# Patient Record
Sex: Female | Born: 1980 | Hispanic: No | Smoking: Never smoker
Health system: Southern US, Community
[De-identification: ages and names within clinical notes are randomized; demographics above are authoritative.]

---

## 2005-07-23 ENCOUNTER — Other Ambulatory Visit: Admission: RE | Admit: 2005-07-23 | Discharge: 2005-07-23 | Payer: Self-pay | Admitting: Family Medicine

## 2006-11-16 ENCOUNTER — Other Ambulatory Visit: Admission: RE | Admit: 2006-11-16 | Discharge: 2006-11-16 | Payer: Self-pay | Admitting: Family Medicine

## 2007-11-08 ENCOUNTER — Other Ambulatory Visit: Admission: RE | Admit: 2007-11-08 | Discharge: 2007-11-08 | Payer: Self-pay | Admitting: Family Medicine

## 2009-04-16 ENCOUNTER — Other Ambulatory Visit: Admission: RE | Admit: 2009-04-16 | Discharge: 2009-04-16 | Payer: Self-pay | Admitting: Family Medicine

## 2012-05-11 ENCOUNTER — Ambulatory Visit: Payer: Self-pay | Admitting: Licensed Clinical Social Worker

## 2012-05-20 ENCOUNTER — Ambulatory Visit (INDEPENDENT_AMBULATORY_CARE_PROVIDER_SITE_OTHER): Payer: BC Managed Care – PPO | Admitting: Licensed Clinical Social Worker

## 2012-05-20 DIAGNOSIS — IMO0002 Reserved for concepts with insufficient information to code with codable children: Secondary | ICD-10-CM

## 2012-06-15 ENCOUNTER — Ambulatory Visit (INDEPENDENT_AMBULATORY_CARE_PROVIDER_SITE_OTHER): Payer: BC Managed Care – PPO | Admitting: Licensed Clinical Social Worker

## 2012-06-15 DIAGNOSIS — IMO0002 Reserved for concepts with insufficient information to code with codable children: Secondary | ICD-10-CM

## 2012-07-27 ENCOUNTER — Ambulatory Visit (INDEPENDENT_AMBULATORY_CARE_PROVIDER_SITE_OTHER): Payer: BC Managed Care – PPO | Admitting: Licensed Clinical Social Worker

## 2012-07-27 DIAGNOSIS — IMO0002 Reserved for concepts with insufficient information to code with codable children: Secondary | ICD-10-CM

## 2012-09-16 ENCOUNTER — Ambulatory Visit (INDEPENDENT_AMBULATORY_CARE_PROVIDER_SITE_OTHER): Payer: BC Managed Care – PPO | Admitting: Licensed Clinical Social Worker

## 2012-09-16 DIAGNOSIS — IMO0002 Reserved for concepts with insufficient information to code with codable children: Secondary | ICD-10-CM

## 2012-11-09 ENCOUNTER — Ambulatory Visit (INDEPENDENT_AMBULATORY_CARE_PROVIDER_SITE_OTHER): Payer: BC Managed Care – PPO | Admitting: Licensed Clinical Social Worker

## 2012-11-09 DIAGNOSIS — IMO0002 Reserved for concepts with insufficient information to code with codable children: Secondary | ICD-10-CM

## 2012-12-09 ENCOUNTER — Ambulatory Visit (INDEPENDENT_AMBULATORY_CARE_PROVIDER_SITE_OTHER): Payer: BC Managed Care – PPO | Admitting: Licensed Clinical Social Worker

## 2012-12-09 DIAGNOSIS — IMO0002 Reserved for concepts with insufficient information to code with codable children: Secondary | ICD-10-CM

## 2013-01-06 ENCOUNTER — Ambulatory Visit (INDEPENDENT_AMBULATORY_CARE_PROVIDER_SITE_OTHER): Payer: BC Managed Care – PPO | Admitting: Licensed Clinical Social Worker

## 2013-01-06 DIAGNOSIS — IMO0002 Reserved for concepts with insufficient information to code with codable children: Secondary | ICD-10-CM

## 2013-02-01 ENCOUNTER — Ambulatory Visit (INDEPENDENT_AMBULATORY_CARE_PROVIDER_SITE_OTHER): Payer: BC Managed Care – PPO | Admitting: Licensed Clinical Social Worker

## 2013-02-01 DIAGNOSIS — IMO0002 Reserved for concepts with insufficient information to code with codable children: Secondary | ICD-10-CM

## 2013-03-22 ENCOUNTER — Ambulatory Visit: Payer: BC Managed Care – PPO | Admitting: Licensed Clinical Social Worker

## 2013-03-29 ENCOUNTER — Ambulatory Visit (INDEPENDENT_AMBULATORY_CARE_PROVIDER_SITE_OTHER): Payer: BC Managed Care – PPO | Admitting: Licensed Clinical Social Worker

## 2013-03-29 DIAGNOSIS — IMO0002 Reserved for concepts with insufficient information to code with codable children: Secondary | ICD-10-CM

## 2013-04-26 ENCOUNTER — Ambulatory Visit (INDEPENDENT_AMBULATORY_CARE_PROVIDER_SITE_OTHER): Payer: BC Managed Care – PPO | Admitting: Licensed Clinical Social Worker

## 2013-04-26 DIAGNOSIS — IMO0002 Reserved for concepts with insufficient information to code with codable children: Secondary | ICD-10-CM

## 2013-05-24 ENCOUNTER — Ambulatory Visit (INDEPENDENT_AMBULATORY_CARE_PROVIDER_SITE_OTHER): Payer: BC Managed Care – PPO | Admitting: Licensed Clinical Social Worker

## 2013-05-24 DIAGNOSIS — IMO0002 Reserved for concepts with insufficient information to code with codable children: Secondary | ICD-10-CM

## 2013-06-23 ENCOUNTER — Ambulatory Visit: Payer: BC Managed Care – PPO | Admitting: Licensed Clinical Social Worker

## 2013-07-26 ENCOUNTER — Ambulatory Visit (INDEPENDENT_AMBULATORY_CARE_PROVIDER_SITE_OTHER): Payer: BC Managed Care – PPO | Admitting: Licensed Clinical Social Worker

## 2013-07-26 DIAGNOSIS — IMO0002 Reserved for concepts with insufficient information to code with codable children: Secondary | ICD-10-CM

## 2013-08-25 ENCOUNTER — Ambulatory Visit (INDEPENDENT_AMBULATORY_CARE_PROVIDER_SITE_OTHER): Payer: BC Managed Care – PPO | Admitting: Licensed Clinical Social Worker

## 2013-08-25 DIAGNOSIS — IMO0002 Reserved for concepts with insufficient information to code with codable children: Secondary | ICD-10-CM

## 2014-02-23 ENCOUNTER — Ambulatory Visit (INDEPENDENT_AMBULATORY_CARE_PROVIDER_SITE_OTHER): Payer: BC Managed Care – PPO | Admitting: Licensed Clinical Social Worker

## 2014-02-23 DIAGNOSIS — IMO0002 Reserved for concepts with insufficient information to code with codable children: Secondary | ICD-10-CM

## 2014-03-30 ENCOUNTER — Ambulatory Visit (INDEPENDENT_AMBULATORY_CARE_PROVIDER_SITE_OTHER): Payer: BC Managed Care – PPO | Admitting: Licensed Clinical Social Worker

## 2014-03-30 DIAGNOSIS — IMO0002 Reserved for concepts with insufficient information to code with codable children: Secondary | ICD-10-CM

## 2014-05-04 ENCOUNTER — Ambulatory Visit (INDEPENDENT_AMBULATORY_CARE_PROVIDER_SITE_OTHER): Payer: BC Managed Care – PPO | Admitting: Licensed Clinical Social Worker

## 2014-05-04 DIAGNOSIS — F3341 Major depressive disorder, recurrent, in partial remission: Secondary | ICD-10-CM

## 2014-05-25 ENCOUNTER — Ambulatory Visit: Payer: BC Managed Care – PPO | Admitting: Licensed Clinical Social Worker

## 2014-06-08 ENCOUNTER — Ambulatory Visit (INDEPENDENT_AMBULATORY_CARE_PROVIDER_SITE_OTHER): Payer: BC Managed Care – PPO | Admitting: Licensed Clinical Social Worker

## 2014-06-08 DIAGNOSIS — F3341 Major depressive disorder, recurrent, in partial remission: Secondary | ICD-10-CM

## 2014-07-06 ENCOUNTER — Ambulatory Visit (INDEPENDENT_AMBULATORY_CARE_PROVIDER_SITE_OTHER): Payer: BC Managed Care – PPO | Admitting: Licensed Clinical Social Worker

## 2014-07-06 DIAGNOSIS — F3341 Major depressive disorder, recurrent, in partial remission: Secondary | ICD-10-CM

## 2014-08-03 ENCOUNTER — Ambulatory Visit (INDEPENDENT_AMBULATORY_CARE_PROVIDER_SITE_OTHER): Payer: BLUE CROSS/BLUE SHIELD | Admitting: Licensed Clinical Social Worker

## 2014-08-03 DIAGNOSIS — F3341 Major depressive disorder, recurrent, in partial remission: Secondary | ICD-10-CM

## 2014-09-07 ENCOUNTER — Ambulatory Visit (INDEPENDENT_AMBULATORY_CARE_PROVIDER_SITE_OTHER): Payer: BLUE CROSS/BLUE SHIELD | Admitting: Licensed Clinical Social Worker

## 2014-09-07 DIAGNOSIS — F3341 Major depressive disorder, recurrent, in partial remission: Secondary | ICD-10-CM

## 2014-10-05 ENCOUNTER — Ambulatory Visit (INDEPENDENT_AMBULATORY_CARE_PROVIDER_SITE_OTHER): Payer: BLUE CROSS/BLUE SHIELD | Admitting: Licensed Clinical Social Worker

## 2014-10-05 DIAGNOSIS — F3341 Major depressive disorder, recurrent, in partial remission: Secondary | ICD-10-CM

## 2014-11-23 ENCOUNTER — Ambulatory Visit (INDEPENDENT_AMBULATORY_CARE_PROVIDER_SITE_OTHER): Payer: BLUE CROSS/BLUE SHIELD | Admitting: Licensed Clinical Social Worker

## 2014-11-23 DIAGNOSIS — F3341 Major depressive disorder, recurrent, in partial remission: Secondary | ICD-10-CM

## 2015-01-04 ENCOUNTER — Ambulatory Visit (INDEPENDENT_AMBULATORY_CARE_PROVIDER_SITE_OTHER): Payer: BLUE CROSS/BLUE SHIELD | Admitting: Licensed Clinical Social Worker

## 2015-01-04 DIAGNOSIS — F3341 Major depressive disorder, recurrent, in partial remission: Secondary | ICD-10-CM

## 2015-02-01 ENCOUNTER — Ambulatory Visit (INDEPENDENT_AMBULATORY_CARE_PROVIDER_SITE_OTHER): Payer: BLUE CROSS/BLUE SHIELD | Admitting: Licensed Clinical Social Worker

## 2015-02-01 DIAGNOSIS — F3341 Major depressive disorder, recurrent, in partial remission: Secondary | ICD-10-CM

## 2015-03-01 ENCOUNTER — Ambulatory Visit (INDEPENDENT_AMBULATORY_CARE_PROVIDER_SITE_OTHER): Payer: BLUE CROSS/BLUE SHIELD | Admitting: Licensed Clinical Social Worker

## 2015-03-01 DIAGNOSIS — F3341 Major depressive disorder, recurrent, in partial remission: Secondary | ICD-10-CM | POA: Diagnosis not present

## 2015-03-29 ENCOUNTER — Ambulatory Visit (INDEPENDENT_AMBULATORY_CARE_PROVIDER_SITE_OTHER): Payer: BLUE CROSS/BLUE SHIELD | Admitting: Licensed Clinical Social Worker

## 2015-03-29 DIAGNOSIS — F3341 Major depressive disorder, recurrent, in partial remission: Secondary | ICD-10-CM | POA: Diagnosis not present

## 2015-04-26 ENCOUNTER — Ambulatory Visit (INDEPENDENT_AMBULATORY_CARE_PROVIDER_SITE_OTHER): Payer: BLUE CROSS/BLUE SHIELD | Admitting: Licensed Clinical Social Worker

## 2015-04-26 DIAGNOSIS — F3341 Major depressive disorder, recurrent, in partial remission: Secondary | ICD-10-CM

## 2015-06-07 ENCOUNTER — Ambulatory Visit: Payer: BLUE CROSS/BLUE SHIELD | Admitting: Licensed Clinical Social Worker

## 2015-07-05 ENCOUNTER — Ambulatory Visit (INDEPENDENT_AMBULATORY_CARE_PROVIDER_SITE_OTHER): Payer: BLUE CROSS/BLUE SHIELD | Admitting: Licensed Clinical Social Worker

## 2015-07-05 DIAGNOSIS — F3341 Major depressive disorder, recurrent, in partial remission: Secondary | ICD-10-CM

## 2015-08-02 ENCOUNTER — Ambulatory Visit: Payer: BLUE CROSS/BLUE SHIELD | Admitting: Licensed Clinical Social Worker

## 2015-08-30 ENCOUNTER — Ambulatory Visit (INDEPENDENT_AMBULATORY_CARE_PROVIDER_SITE_OTHER): Payer: BLUE CROSS/BLUE SHIELD | Admitting: Licensed Clinical Social Worker

## 2015-08-30 DIAGNOSIS — F3341 Major depressive disorder, recurrent, in partial remission: Secondary | ICD-10-CM | POA: Diagnosis not present

## 2015-09-27 ENCOUNTER — Ambulatory Visit (INDEPENDENT_AMBULATORY_CARE_PROVIDER_SITE_OTHER): Payer: BLUE CROSS/BLUE SHIELD | Admitting: Licensed Clinical Social Worker

## 2015-09-27 DIAGNOSIS — F3341 Major depressive disorder, recurrent, in partial remission: Secondary | ICD-10-CM | POA: Diagnosis not present

## 2015-10-25 ENCOUNTER — Ambulatory Visit (INDEPENDENT_AMBULATORY_CARE_PROVIDER_SITE_OTHER): Payer: BLUE CROSS/BLUE SHIELD | Admitting: Licensed Clinical Social Worker

## 2015-10-25 DIAGNOSIS — F3341 Major depressive disorder, recurrent, in partial remission: Secondary | ICD-10-CM

## 2015-11-29 ENCOUNTER — Ambulatory Visit: Payer: BLUE CROSS/BLUE SHIELD | Admitting: Licensed Clinical Social Worker

## 2015-12-20 ENCOUNTER — Ambulatory Visit: Payer: Self-pay | Admitting: Licensed Clinical Social Worker

## 2016-01-03 ENCOUNTER — Ambulatory Visit (INDEPENDENT_AMBULATORY_CARE_PROVIDER_SITE_OTHER): Payer: BLUE CROSS/BLUE SHIELD | Admitting: Licensed Clinical Social Worker

## 2016-01-03 DIAGNOSIS — F3341 Major depressive disorder, recurrent, in partial remission: Secondary | ICD-10-CM

## 2016-01-10 ENCOUNTER — Ambulatory Visit (INDEPENDENT_AMBULATORY_CARE_PROVIDER_SITE_OTHER): Payer: BLUE CROSS/BLUE SHIELD | Admitting: Licensed Clinical Social Worker

## 2016-01-10 DIAGNOSIS — F3341 Major depressive disorder, recurrent, in partial remission: Secondary | ICD-10-CM | POA: Diagnosis not present

## 2016-01-31 ENCOUNTER — Ambulatory Visit (INDEPENDENT_AMBULATORY_CARE_PROVIDER_SITE_OTHER): Payer: BLUE CROSS/BLUE SHIELD | Admitting: Licensed Clinical Social Worker

## 2016-01-31 DIAGNOSIS — F3341 Major depressive disorder, recurrent, in partial remission: Secondary | ICD-10-CM | POA: Diagnosis not present

## 2016-04-24 ENCOUNTER — Ambulatory Visit (INDEPENDENT_AMBULATORY_CARE_PROVIDER_SITE_OTHER): Payer: BLUE CROSS/BLUE SHIELD | Admitting: Licensed Clinical Social Worker

## 2016-04-24 DIAGNOSIS — F3341 Major depressive disorder, recurrent, in partial remission: Secondary | ICD-10-CM

## 2016-05-29 ENCOUNTER — Ambulatory Visit (INDEPENDENT_AMBULATORY_CARE_PROVIDER_SITE_OTHER): Payer: BLUE CROSS/BLUE SHIELD | Admitting: Licensed Clinical Social Worker

## 2016-05-29 DIAGNOSIS — F3341 Major depressive disorder, recurrent, in partial remission: Secondary | ICD-10-CM | POA: Diagnosis not present

## 2016-06-06 DIAGNOSIS — F330 Major depressive disorder, recurrent, mild: Secondary | ICD-10-CM | POA: Diagnosis not present

## 2016-06-06 DIAGNOSIS — I10 Essential (primary) hypertension: Secondary | ICD-10-CM | POA: Diagnosis not present

## 2016-06-06 DIAGNOSIS — Z6841 Body Mass Index (BMI) 40.0 and over, adult: Secondary | ICD-10-CM | POA: Diagnosis not present

## 2016-07-03 ENCOUNTER — Ambulatory Visit (INDEPENDENT_AMBULATORY_CARE_PROVIDER_SITE_OTHER): Payer: BLUE CROSS/BLUE SHIELD | Admitting: Licensed Clinical Social Worker

## 2016-07-03 DIAGNOSIS — F3341 Major depressive disorder, recurrent, in partial remission: Secondary | ICD-10-CM | POA: Diagnosis not present

## 2016-07-31 ENCOUNTER — Ambulatory Visit (INDEPENDENT_AMBULATORY_CARE_PROVIDER_SITE_OTHER): Payer: BLUE CROSS/BLUE SHIELD | Admitting: Licensed Clinical Social Worker

## 2016-07-31 DIAGNOSIS — F3341 Major depressive disorder, recurrent, in partial remission: Secondary | ICD-10-CM | POA: Diagnosis not present

## 2016-09-04 ENCOUNTER — Ambulatory Visit: Payer: BLUE CROSS/BLUE SHIELD | Admitting: Licensed Clinical Social Worker

## 2016-09-09 DIAGNOSIS — E782 Mixed hyperlipidemia: Secondary | ICD-10-CM | POA: Diagnosis not present

## 2016-09-09 DIAGNOSIS — F329 Major depressive disorder, single episode, unspecified: Secondary | ICD-10-CM | POA: Diagnosis not present

## 2016-09-09 DIAGNOSIS — I10 Essential (primary) hypertension: Secondary | ICD-10-CM | POA: Diagnosis not present

## 2016-09-09 DIAGNOSIS — Z23 Encounter for immunization: Secondary | ICD-10-CM | POA: Diagnosis not present

## 2016-09-27 DIAGNOSIS — M545 Low back pain: Secondary | ICD-10-CM | POA: Diagnosis not present

## 2016-10-02 ENCOUNTER — Ambulatory Visit (INDEPENDENT_AMBULATORY_CARE_PROVIDER_SITE_OTHER): Payer: BLUE CROSS/BLUE SHIELD | Admitting: Licensed Clinical Social Worker

## 2016-10-02 DIAGNOSIS — F3341 Major depressive disorder, recurrent, in partial remission: Secondary | ICD-10-CM

## 2016-10-10 ENCOUNTER — Ambulatory Visit (INDEPENDENT_AMBULATORY_CARE_PROVIDER_SITE_OTHER): Payer: Self-pay

## 2016-10-10 ENCOUNTER — Ambulatory Visit (INDEPENDENT_AMBULATORY_CARE_PROVIDER_SITE_OTHER): Payer: BLUE CROSS/BLUE SHIELD | Admitting: Orthopaedic Surgery

## 2016-10-10 ENCOUNTER — Encounter (INDEPENDENT_AMBULATORY_CARE_PROVIDER_SITE_OTHER): Payer: Self-pay | Admitting: Orthopaedic Surgery

## 2016-10-10 DIAGNOSIS — G8929 Other chronic pain: Secondary | ICD-10-CM | POA: Diagnosis not present

## 2016-10-10 DIAGNOSIS — M545 Low back pain: Secondary | ICD-10-CM

## 2016-10-10 MED ORDER — PREDNISONE 10 MG (21) PO TBPK: ORAL_TABLET | ORAL | 0 refills | Status: AC

## 2016-10-10 NOTE — Progress Notes (Signed)
   Office Visit Note   Patient: Victoria Lane           Date of Birth: 06/27/1981           MRN: [id number] Visit Date: 10/10/2016              Requested by: No referring provider defined for this encounter. PCP: Maryelizabeth RowanEWEY,ELIZABETH, MD   Assessment & Plan: Visit Diagnoses:  1. Chronic low back pain, unspecified back pain laterality, with sciatica presence unspecified     Plan: Steroid taper prescribed today. Given failure of conservative treatment would recommend MRI lumbar spine.  Follow-Up Instructions: Return in about 2 weeks (around 10/24/2016).   Orders:  Orders Placed This Encounter  Procedures  . XR Lumbar Spine 2-3 Views   Meds ordered this encounter  Medications  . predniSONE (STERAPRED UNI-PAK 21 TAB) 10 MG (21) TBPK tablet    Sig: Take as directed    Dispense:  21 tablet    Refill:  0      Procedures: No procedures performed   Clinical Data: No additional findings.   Subjective: Chief Complaint  Patient presents with  . Lower Back - Pain    Patient is a 36 year old female who has had ongoing back issues for the last 15-20 years. She denies any injuries. Denies any constitutional symptoms or incontinence. She has episodic flareups with low back pain that radiates into her legs. She has seen her primary care doctor and a chiropractor recently and she is getting worse.    Review of Systems  Constitutional: Negative.   HENT: Negative.   Eyes: Negative.   Respiratory: Negative.   Cardiovascular: Negative.   Endocrine: Negative.   Musculoskeletal: Negative.   Neurological: Negative.   Hematological: Negative.   Psychiatric/Behavioral: Negative.   All other systems reviewed and are negative.    Objective: Vital Signs: There were no vitals taken for this visit.  Physical Exam  Constitutional: She is oriented to person, place, and time. She appears well-developed and well-nourished.  HENT:  Head: Normocephalic and atraumatic.  Eyes: EOM are  normal.  Neck: Neck supple.  Pulmonary/Chest: Effort normal.  Abdominal: Soft.  Neurological: She is alert and oriented to person, place, and time.  Skin: Skin is warm. Capillary refill takes less than 2 seconds.  Psychiatric: She has a normal mood and affect. Her behavior is normal. Judgment and thought content normal.  Nursing note and vitals reviewed.   Ortho Exam Low back exam bilateral lower extremity exam shows no focal motor sensory deficits. Normal reflexes. Specialty Comments:  No specialty comments available.  Imaging: No results found.   PMFS History: There are no active problems to display for this patient.  No past medical history on file.  No family history on file.  No past surgical history on file. Social History   Occupational History  . Not on file.   Social History Main Topics  . Smoking status: Never Smoker  . Smokeless tobacco: Never Used  . Alcohol use Not on file  . Drug use: Unknown  . Sexual activity: Not on file

## 2016-10-10 NOTE — Addendum Note (Signed)
Addended by: Albertina ParrGARCIA, Winni Ehrhard on: 10/10/2016 09:28 AM   Modules accepted: Orders

## 2016-10-21 ENCOUNTER — Ambulatory Visit (INDEPENDENT_AMBULATORY_CARE_PROVIDER_SITE_OTHER): Payer: Self-pay | Admitting: Orthopaedic Surgery

## 2016-10-25 ENCOUNTER — Ambulatory Visit
Admission: RE | Admit: 2016-10-25 | Discharge: 2016-10-25 | Disposition: A | Discharge status: Home / Self Care | Payer: BLUE CROSS/BLUE SHIELD | Source: Ambulatory Visit | Attending: Orthopaedic Surgery | Admitting: Orthopaedic Surgery

## 2016-10-25 DIAGNOSIS — G8929 Other chronic pain: Secondary | ICD-10-CM

## 2016-10-25 DIAGNOSIS — M545 Low back pain: Principal | ICD-10-CM

## 2016-10-25 DIAGNOSIS — M48061 Spinal stenosis, lumbar region without neurogenic claudication: Secondary | ICD-10-CM | POA: Diagnosis not present

## 2016-10-28 ENCOUNTER — Ambulatory Visit (INDEPENDENT_AMBULATORY_CARE_PROVIDER_SITE_OTHER): Payer: BLUE CROSS/BLUE SHIELD | Admitting: Orthopaedic Surgery

## 2016-10-28 ENCOUNTER — Encounter (INDEPENDENT_AMBULATORY_CARE_PROVIDER_SITE_OTHER): Payer: Self-pay | Admitting: Orthopaedic Surgery

## 2016-10-28 DIAGNOSIS — M545 Low back pain: Secondary | ICD-10-CM

## 2016-10-28 DIAGNOSIS — G8929 Other chronic pain: Secondary | ICD-10-CM | POA: Diagnosis not present

## 2016-10-28 NOTE — Progress Notes (Signed)
   Office Visit Note   Patient: Victoria Lane           Date of Birth: 22-Jul-1980           MRN: [id number] Visit Date: 10/28/2016              Requested by: Lewis Moccasin, MD [address] [place], [place] [address] PCP: Maryelizabeth Rowan, MD   Assessment & Plan: Visit Diagnoses:  1. Chronic bilateral low back pain, with sciatica presence unspecified     Plan: MRI of the lumbar spine shows a large posterior lateral disc herniation at L4-L5. I will like her to see Dr. Conchita Paris of neurosurgery to discuss possible surgery.  Follow-Up Instructions: Return if symptoms worsen or fail to improve.   Orders:  Orders Placed This Encounter  Procedures  . Ambulatory referral to Neurosurgery   No orders of the defined types were placed in this encounter.     Procedures: No procedures performed   Clinical Data: No additional findings.   Subjective: Chief Complaint  Patient presents with  . Lower Back - Pain    Victoria Lane follows up today for MRI of her lumbar spine. She is feeling better without any radicular signs or symptoms. She does say that she episodically have flareups where she has severe pain radiating down her right leg. She is worried that when she has these episodes she denies essentially incapacitated.    Review of Systems   Objective: Vital Signs: There were no vitals taken for this visit.  Physical Exam  Ortho Exam Exam is stable. Specialty Comments:  No specialty comments available.  Imaging: No results found.   PMFS History: There are no active problems to display for this patient.  No past medical history on file.  No family history on file.  No past surgical history on file. Social History   Occupational History  . Not on file.   Social History Main Topics  . Smoking status: Never Smoker  . Smokeless tobacco: Never Used  . Alcohol use Not on file  . Drug use: Unknown  . Sexual activity: Not on file

## 2016-11-13 DIAGNOSIS — Z6841 Body Mass Index (BMI) 40.0 and over, adult: Secondary | ICD-10-CM | POA: Diagnosis not present

## 2016-11-13 DIAGNOSIS — Z23 Encounter for immunization: Secondary | ICD-10-CM | POA: Diagnosis not present

## 2016-11-13 DIAGNOSIS — I10 Essential (primary) hypertension: Secondary | ICD-10-CM | POA: Diagnosis not present

## 2016-11-13 DIAGNOSIS — M549 Dorsalgia, unspecified: Secondary | ICD-10-CM | POA: Diagnosis not present

## 2016-11-13 DIAGNOSIS — E782 Mixed hyperlipidemia: Secondary | ICD-10-CM | POA: Diagnosis not present

## 2016-11-24 ENCOUNTER — Ambulatory Visit (INDEPENDENT_AMBULATORY_CARE_PROVIDER_SITE_OTHER): Payer: BLUE CROSS/BLUE SHIELD | Admitting: Licensed Clinical Social Worker

## 2016-11-24 DIAGNOSIS — F3341 Major depressive disorder, recurrent, in partial remission: Secondary | ICD-10-CM | POA: Diagnosis not present

## 2017-02-27 DIAGNOSIS — R42 Dizziness and giddiness: Secondary | ICD-10-CM | POA: Diagnosis not present

## 2017-02-27 DIAGNOSIS — J3089 Other allergic rhinitis: Secondary | ICD-10-CM | POA: Diagnosis not present

## 2017-04-13 ENCOUNTER — Ambulatory Visit (INDEPENDENT_AMBULATORY_CARE_PROVIDER_SITE_OTHER): Payer: BLUE CROSS/BLUE SHIELD | Admitting: Licensed Clinical Social Worker

## 2017-04-13 DIAGNOSIS — F3341 Major depressive disorder, recurrent, in partial remission: Secondary | ICD-10-CM | POA: Diagnosis not present

## 2017-05-07 DIAGNOSIS — Z1329 Encounter for screening for other suspected endocrine disorder: Secondary | ICD-10-CM | POA: Diagnosis not present

## 2017-05-07 DIAGNOSIS — Z0000 Encounter for general adult medical examination without abnormal findings: Secondary | ICD-10-CM | POA: Diagnosis not present

## 2017-05-07 DIAGNOSIS — Z13220 Encounter for screening for lipoid disorders: Secondary | ICD-10-CM | POA: Diagnosis not present

## 2017-05-07 DIAGNOSIS — Z114 Encounter for screening for human immunodeficiency virus [HIV]: Secondary | ICD-10-CM | POA: Diagnosis not present

## 2017-05-08 DIAGNOSIS — Z0000 Encounter for general adult medical examination without abnormal findings: Secondary | ICD-10-CM | POA: Diagnosis not present

## 2017-05-08 DIAGNOSIS — Z23 Encounter for immunization: Secondary | ICD-10-CM | POA: Diagnosis not present

## 2017-05-08 DIAGNOSIS — Z6841 Body Mass Index (BMI) 40.0 and over, adult: Secondary | ICD-10-CM | POA: Diagnosis not present

## 2017-05-18 ENCOUNTER — Ambulatory Visit (INDEPENDENT_AMBULATORY_CARE_PROVIDER_SITE_OTHER): Payer: BLUE CROSS/BLUE SHIELD | Admitting: Licensed Clinical Social Worker

## 2017-05-18 DIAGNOSIS — F3341 Major depressive disorder, recurrent, in partial remission: Secondary | ICD-10-CM | POA: Diagnosis not present

## 2017-05-29 ENCOUNTER — Ambulatory Visit: Payer: BLUE CROSS/BLUE SHIELD | Admitting: Podiatry

## 2017-06-17 ENCOUNTER — Ambulatory Visit (INDEPENDENT_AMBULATORY_CARE_PROVIDER_SITE_OTHER): Payer: BLUE CROSS/BLUE SHIELD | Admitting: Licensed Clinical Social Worker

## 2017-06-17 DIAGNOSIS — F3341 Major depressive disorder, recurrent, in partial remission: Secondary | ICD-10-CM

## 2017-10-21 ENCOUNTER — Ambulatory Visit (INDEPENDENT_AMBULATORY_CARE_PROVIDER_SITE_OTHER): Payer: BLUE CROSS/BLUE SHIELD | Admitting: Licensed Clinical Social Worker

## 2017-10-21 DIAGNOSIS — F429 Obsessive-compulsive disorder, unspecified: Secondary | ICD-10-CM | POA: Diagnosis not present

## 2017-10-30 DIAGNOSIS — F419 Anxiety disorder, unspecified: Secondary | ICD-10-CM | POA: Diagnosis not present

## 2017-10-30 DIAGNOSIS — Z6841 Body Mass Index (BMI) 40.0 and over, adult: Secondary | ICD-10-CM | POA: Diagnosis not present

## 2017-10-30 DIAGNOSIS — J3089 Other allergic rhinitis: Secondary | ICD-10-CM | POA: Diagnosis not present

## 2017-10-30 DIAGNOSIS — I10 Essential (primary) hypertension: Secondary | ICD-10-CM | POA: Diagnosis not present

## 2017-12-04 DIAGNOSIS — Z6841 Body Mass Index (BMI) 40.0 and over, adult: Secondary | ICD-10-CM | POA: Diagnosis not present

## 2017-12-04 DIAGNOSIS — F419 Anxiety disorder, unspecified: Secondary | ICD-10-CM | POA: Diagnosis not present

## 2017-12-04 DIAGNOSIS — F330 Major depressive disorder, recurrent, mild: Secondary | ICD-10-CM | POA: Diagnosis not present

## 2018-01-13 DIAGNOSIS — F429 Obsessive-compulsive disorder, unspecified: Secondary | ICD-10-CM | POA: Diagnosis not present

## 2018-01-13 DIAGNOSIS — F4010 Social phobia, unspecified: Secondary | ICD-10-CM | POA: Diagnosis not present

## 2018-01-13 DIAGNOSIS — F958 Other tic disorders: Secondary | ICD-10-CM | POA: Diagnosis not present

## 2018-01-13 DIAGNOSIS — F330 Major depressive disorder, recurrent, mild: Secondary | ICD-10-CM | POA: Diagnosis not present

## 2018-02-05 DIAGNOSIS — F330 Major depressive disorder, recurrent, mild: Secondary | ICD-10-CM | POA: Diagnosis not present

## 2018-02-05 DIAGNOSIS — Z131 Encounter for screening for diabetes mellitus: Secondary | ICD-10-CM | POA: Diagnosis not present

## 2018-02-05 DIAGNOSIS — R635 Abnormal weight gain: Secondary | ICD-10-CM | POA: Diagnosis not present

## 2018-02-05 DIAGNOSIS — E669 Obesity, unspecified: Secondary | ICD-10-CM | POA: Diagnosis not present

## 2018-02-05 DIAGNOSIS — F419 Anxiety disorder, unspecified: Secondary | ICD-10-CM | POA: Diagnosis not present

## 2018-02-05 DIAGNOSIS — Z6841 Body Mass Index (BMI) 40.0 and over, adult: Secondary | ICD-10-CM | POA: Diagnosis not present

## 2018-02-12 DIAGNOSIS — F958 Other tic disorders: Secondary | ICD-10-CM | POA: Diagnosis not present

## 2018-02-12 DIAGNOSIS — F330 Major depressive disorder, recurrent, mild: Secondary | ICD-10-CM | POA: Diagnosis not present

## 2018-02-12 DIAGNOSIS — F4010 Social phobia, unspecified: Secondary | ICD-10-CM | POA: Diagnosis not present

## 2018-02-12 DIAGNOSIS — F429 Obsessive-compulsive disorder, unspecified: Secondary | ICD-10-CM | POA: Diagnosis not present

## 2018-03-17 ENCOUNTER — Ambulatory Visit: Payer: BLUE CROSS/BLUE SHIELD | Admitting: Licensed Clinical Social Worker

## 2018-03-25 DIAGNOSIS — F330 Major depressive disorder, recurrent, mild: Secondary | ICD-10-CM | POA: Diagnosis not present

## 2018-03-25 DIAGNOSIS — F4010 Social phobia, unspecified: Secondary | ICD-10-CM | POA: Diagnosis not present

## 2018-03-25 DIAGNOSIS — F429 Obsessive-compulsive disorder, unspecified: Secondary | ICD-10-CM | POA: Diagnosis not present

## 2018-04-12 ENCOUNTER — Ambulatory Visit: Payer: BLUE CROSS/BLUE SHIELD | Admitting: Licensed Clinical Social Worker

## 2018-04-19 ENCOUNTER — Ambulatory Visit (INDEPENDENT_AMBULATORY_CARE_PROVIDER_SITE_OTHER): Payer: BLUE CROSS/BLUE SHIELD | Admitting: Licensed Clinical Social Worker

## 2018-04-19 DIAGNOSIS — F429 Obsessive-compulsive disorder, unspecified: Secondary | ICD-10-CM

## 2018-05-11 DIAGNOSIS — Z1329 Encounter for screening for other suspected endocrine disorder: Secondary | ICD-10-CM | POA: Diagnosis not present

## 2018-05-11 DIAGNOSIS — Z114 Encounter for screening for human immunodeficiency virus [HIV]: Secondary | ICD-10-CM | POA: Diagnosis not present

## 2018-05-11 DIAGNOSIS — Z13220 Encounter for screening for lipoid disorders: Secondary | ICD-10-CM | POA: Diagnosis not present

## 2018-05-11 DIAGNOSIS — Z0000 Encounter for general adult medical examination without abnormal findings: Secondary | ICD-10-CM | POA: Diagnosis not present

## 2018-05-11 DIAGNOSIS — F419 Anxiety disorder, unspecified: Secondary | ICD-10-CM | POA: Diagnosis not present

## 2018-05-11 DIAGNOSIS — Z23 Encounter for immunization: Secondary | ICD-10-CM | POA: Diagnosis not present

## 2018-05-11 DIAGNOSIS — Z6841 Body Mass Index (BMI) 40.0 and over, adult: Secondary | ICD-10-CM | POA: Diagnosis not present

## 2018-05-20 ENCOUNTER — Ambulatory Visit (INDEPENDENT_AMBULATORY_CARE_PROVIDER_SITE_OTHER): Payer: BLUE CROSS/BLUE SHIELD | Admitting: Licensed Clinical Social Worker

## 2018-05-20 DIAGNOSIS — F3341 Major depressive disorder, recurrent, in partial remission: Secondary | ICD-10-CM

## 2018-06-11 ENCOUNTER — Ambulatory Visit (INDEPENDENT_AMBULATORY_CARE_PROVIDER_SITE_OTHER): Payer: BLUE CROSS/BLUE SHIELD | Admitting: Licensed Clinical Social Worker

## 2018-06-11 DIAGNOSIS — F3341 Major depressive disorder, recurrent, in partial remission: Secondary | ICD-10-CM

## 2018-06-24 ENCOUNTER — Ambulatory Visit (INDEPENDENT_AMBULATORY_CARE_PROVIDER_SITE_OTHER): Payer: BLUE CROSS/BLUE SHIELD | Admitting: Licensed Clinical Social Worker

## 2018-06-24 DIAGNOSIS — F3341 Major depressive disorder, recurrent, in partial remission: Secondary | ICD-10-CM

## 2018-07-01 DIAGNOSIS — F339 Major depressive disorder, recurrent, unspecified: Secondary | ICD-10-CM | POA: Diagnosis not present

## 2018-07-01 DIAGNOSIS — F429 Obsessive-compulsive disorder, unspecified: Secondary | ICD-10-CM | POA: Diagnosis not present

## 2018-07-05 DIAGNOSIS — F408 Other phobic anxiety disorders: Secondary | ICD-10-CM | POA: Diagnosis not present

## 2018-07-08 ENCOUNTER — Ambulatory Visit (INDEPENDENT_AMBULATORY_CARE_PROVIDER_SITE_OTHER): Payer: BLUE CROSS/BLUE SHIELD | Admitting: Licensed Clinical Social Worker

## 2018-07-08 DIAGNOSIS — F3341 Major depressive disorder, recurrent, in partial remission: Secondary | ICD-10-CM

## 2018-07-20 DIAGNOSIS — J40 Bronchitis, not specified as acute or chronic: Secondary | ICD-10-CM | POA: Diagnosis not present

## 2018-07-20 DIAGNOSIS — R05 Cough: Secondary | ICD-10-CM | POA: Diagnosis not present

## 2018-07-22 ENCOUNTER — Ambulatory Visit (INDEPENDENT_AMBULATORY_CARE_PROVIDER_SITE_OTHER): Payer: BLUE CROSS/BLUE SHIELD | Admitting: Licensed Clinical Social Worker

## 2018-07-22 DIAGNOSIS — F3341 Major depressive disorder, recurrent, in partial remission: Secondary | ICD-10-CM

## 2018-08-05 ENCOUNTER — Ambulatory Visit (INDEPENDENT_AMBULATORY_CARE_PROVIDER_SITE_OTHER): Payer: BLUE CROSS/BLUE SHIELD | Admitting: Licensed Clinical Social Worker

## 2018-08-05 DIAGNOSIS — F3341 Major depressive disorder, recurrent, in partial remission: Secondary | ICD-10-CM

## 2018-08-19 ENCOUNTER — Ambulatory Visit (INDEPENDENT_AMBULATORY_CARE_PROVIDER_SITE_OTHER): Payer: BLUE CROSS/BLUE SHIELD | Admitting: Licensed Clinical Social Worker

## 2018-08-19 DIAGNOSIS — F3341 Major depressive disorder, recurrent, in partial remission: Secondary | ICD-10-CM

## 2018-08-23 DIAGNOSIS — F408 Other phobic anxiety disorders: Secondary | ICD-10-CM | POA: Diagnosis not present

## 2018-09-16 ENCOUNTER — Ambulatory Visit (INDEPENDENT_AMBULATORY_CARE_PROVIDER_SITE_OTHER): Payer: BLUE CROSS/BLUE SHIELD | Admitting: Licensed Clinical Social Worker

## 2018-09-16 DIAGNOSIS — F3341 Major depressive disorder, recurrent, in partial remission: Secondary | ICD-10-CM

## 2018-09-16 IMAGING — MR MR LUMBAR SPINE W/O CM
4 of 5 series · 19 of 48 positions shown · non-contrast
Comparison: 10/10/2016 lumbar radiographs.

CLINICAL DATA: 35 y/o F; chronic lower back pain with pain and
weakness in both legs.

EXAM:
MRI LUMBAR SPINE WITHOUT CONTRAST
TECHNIQUE: Multiplanar, multisequence MR imaging of the lumbar spine was
performed. No intravenous contrast was administered.

[Series 6: T2 · sagittal · 4.0mm · 0.73mm/px · 7 of 15 slices shown (1 of 2)]
[im 1/15]
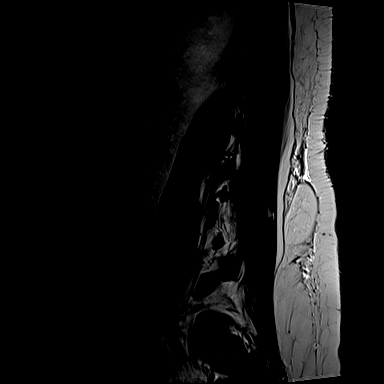
[im 3/15]
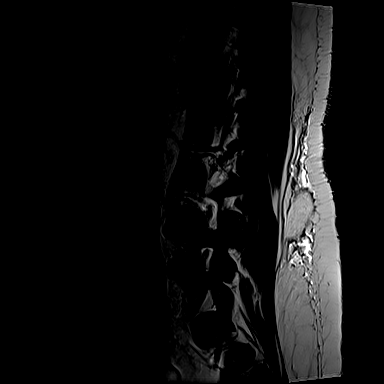
[im 5/15]
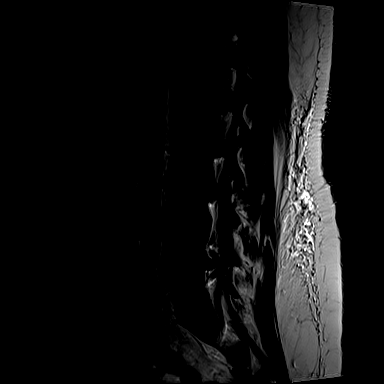
[im 8/15]
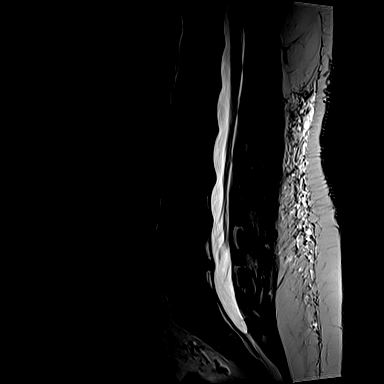
[im 10/15]
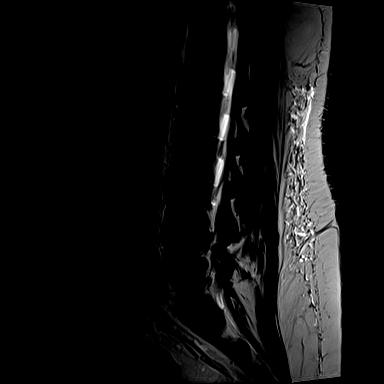
[im 12/15]
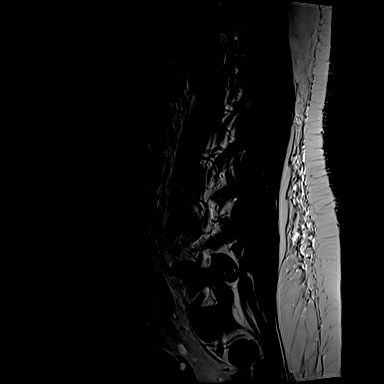
[im 15/15]
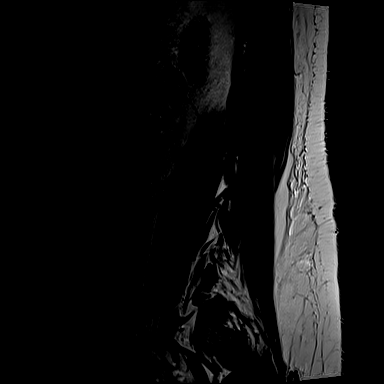

[Series 7: T1 · sagittal · 4.0mm · 0.73mm/px · 3 of 15 slices shown (1 of 2)]
[im 3/15]
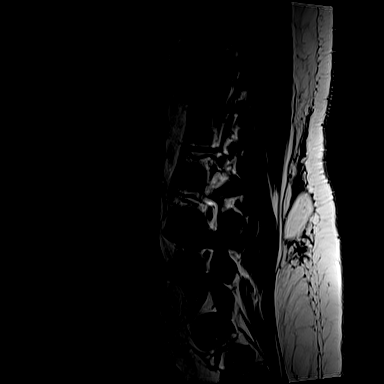
[im 8/15]
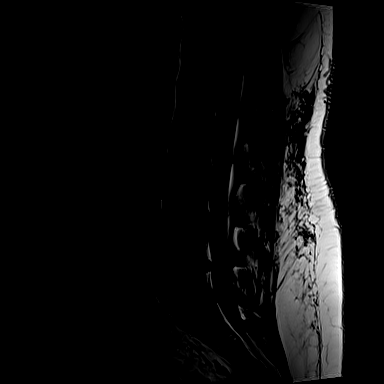
[im 12/15]
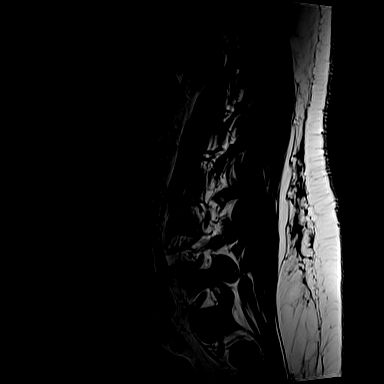

[Series 11: T1 · axial · 4.0mm · 0.28mm/px · z∈[+21,+145]mm · 3 of 33 slices shown (2 of 2)]
[im 5/33]
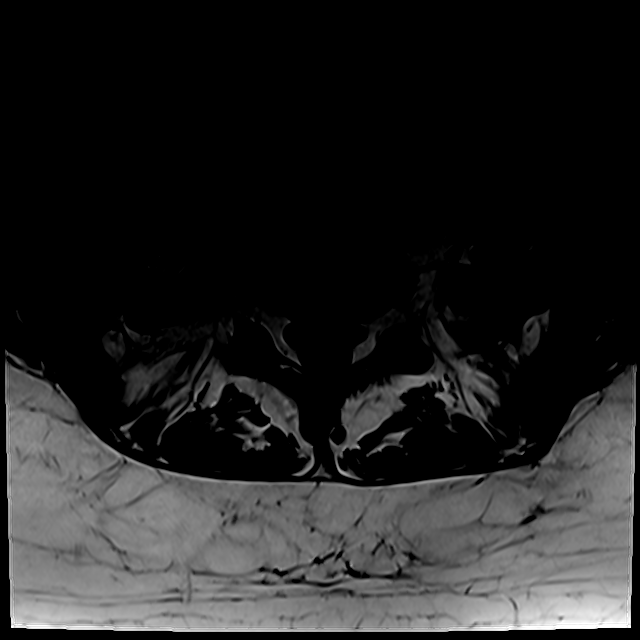
[im 18/33]
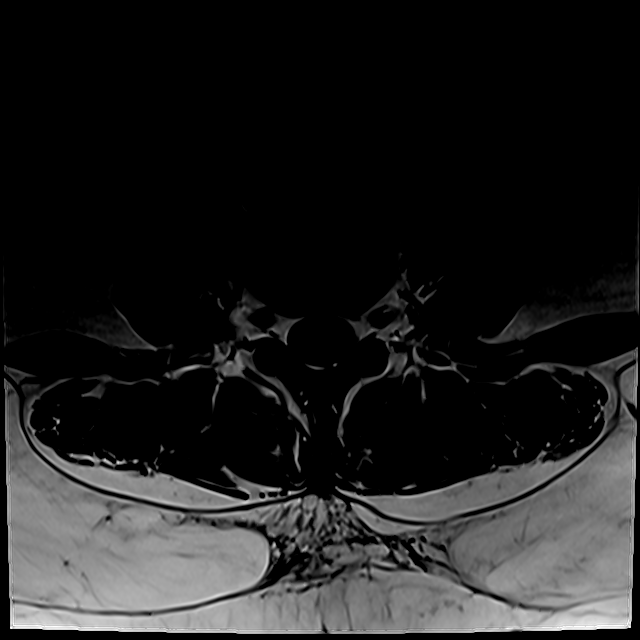
[im 28/33]
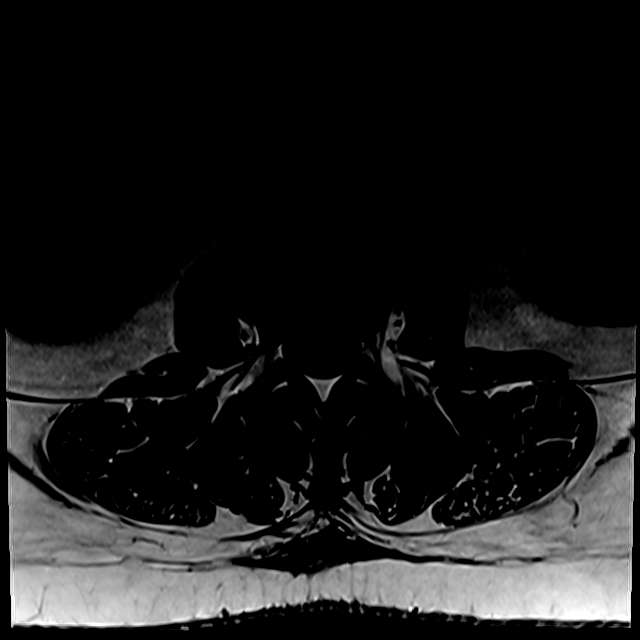

[Series 14: T2 · axial · 4.0mm · 0.28mm/px · z∈[+1,+145]mm · 6 of 33 slices shown (2 of 2)]
[im 1/33]
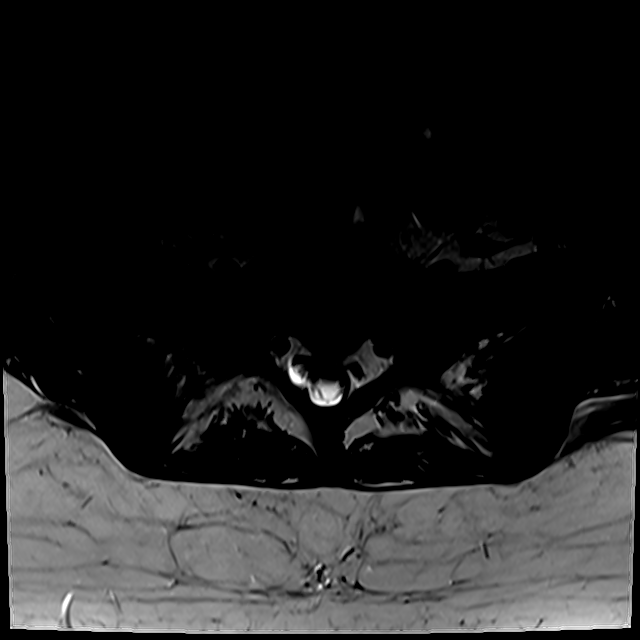
[im 5/33]
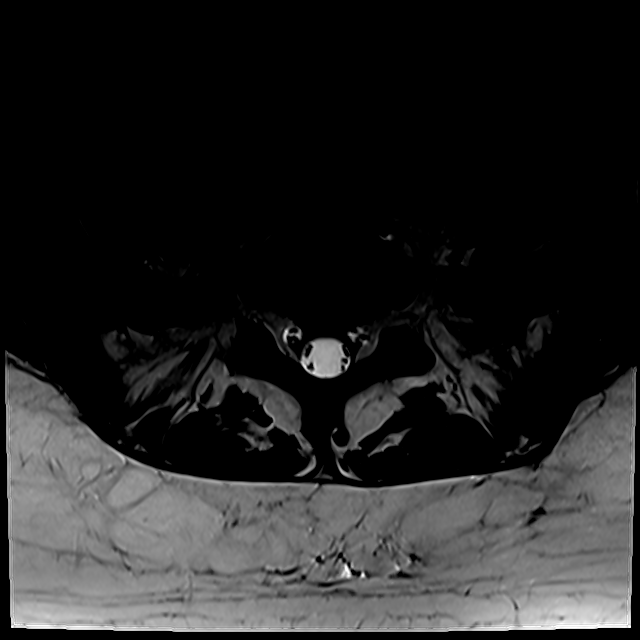
[im 10/33]
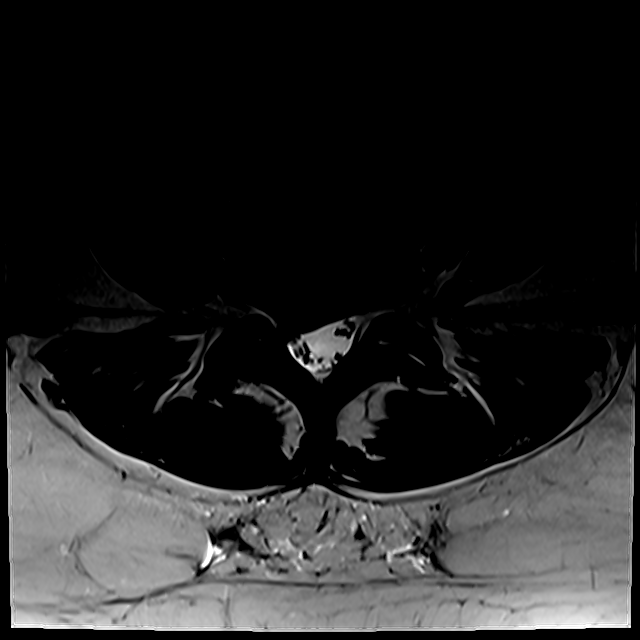
[im 15/33]
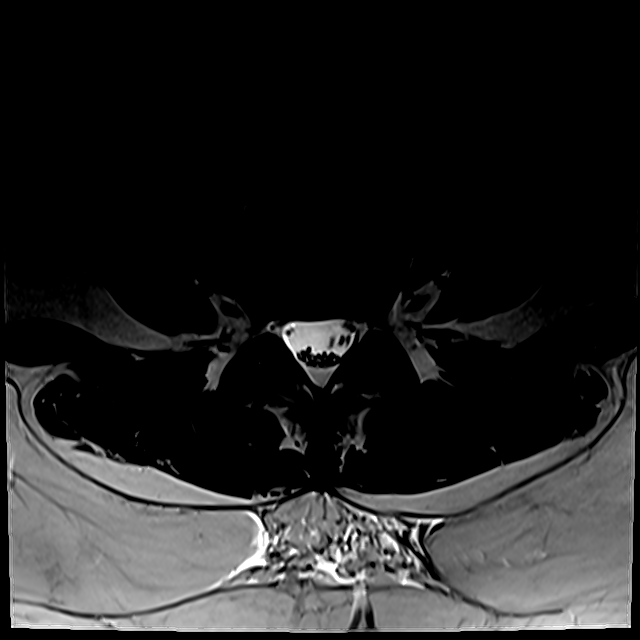
[im 18/33]
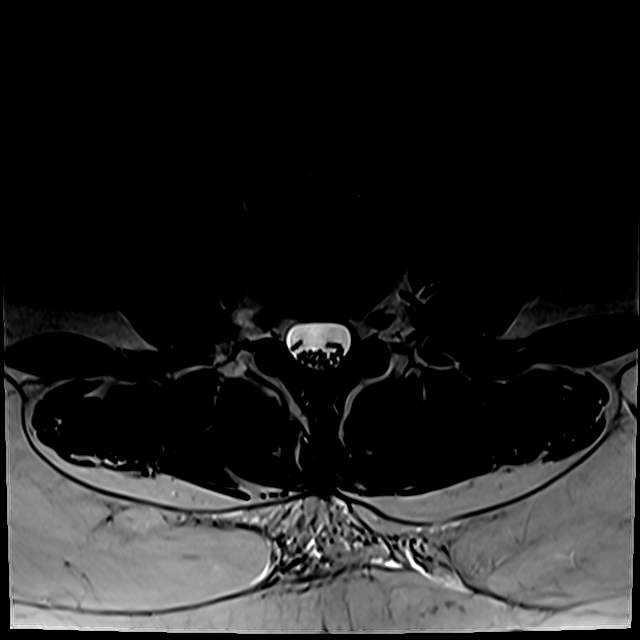
[im 28/33]
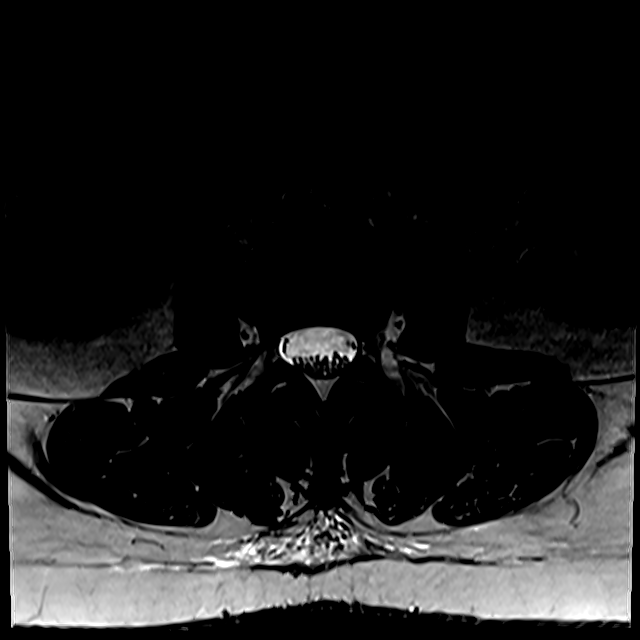

[19 of 48 positions shown; findings below may reference images not displayed]

FINDINGS: Segmentation: Transitional L5 vertebral body with articulation of
the lateral processes to the sacral ala bilaterally.

Alignment:  Straightening of lumbar lordosis.  No listhesis.

Vertebrae: No fracture, evidence of discitis, or bone lesion. Mild
degenerative endplate edema at the L4-5 level. Bilateral L4-5 mild
degenerate facet effusions.

Conus medullaris: Extends to the L1 level and appears normal.

Paraspinal and other soft tissues: Negative.

Disc levels:

L1-2: No significant disc displacement, foraminal narrowing, or
canal stenosis.

L2-3: Small central disc protrusion. No significant foraminal
narrowing or canal stenosis.

L3-4: Small disc bulge and mild bilateral facet hypertrophy. No
significant foraminal narrowing or canal stenosis.

L4-5: Right subarticular disc protrusion measuring 10 x 15 x 9 mm
(AP x ML x CC series 6: Image [[id number]]). The disc protrusion
narrows the right lateral recess with impingement on the descending
right L5 nerve root in the lateral recess. No significant foraminal
narrowing or canal stenosis. Mild bilateral facet hypertrophy.

L5-S1: No significant disc displacement, foraminal narrowing, or
canal stenosis. Mild facet hypertrophy.
IMPRESSION: 1. Discogenic degenerative disease predominantly at the L4-5 level
where there is a right subarticular disc protrusion narrowing the
right lateral recess with impingement on the descending right L5
nerve root.
2. Mild degenerative facet effusions at the L4-5 level bilaterally.
3. No significant foraminal narrowing or canal stenosis.

By: Gunther Dodd M.D.

## 2018-10-04 DIAGNOSIS — F408 Other phobic anxiety disorders: Secondary | ICD-10-CM | POA: Diagnosis not present

## 2018-10-07 DIAGNOSIS — F408 Other phobic anxiety disorders: Secondary | ICD-10-CM | POA: Diagnosis not present

## 2018-10-22 DIAGNOSIS — F408 Other phobic anxiety disorders: Secondary | ICD-10-CM | POA: Diagnosis not present

## 2018-10-25 DIAGNOSIS — F408 Other phobic anxiety disorders: Secondary | ICD-10-CM | POA: Diagnosis not present

## 2018-10-27 ENCOUNTER — Ambulatory Visit (INDEPENDENT_AMBULATORY_CARE_PROVIDER_SITE_OTHER): Payer: BLUE CROSS/BLUE SHIELD | Admitting: Licensed Clinical Social Worker

## 2018-10-27 DIAGNOSIS — F3341 Major depressive disorder, recurrent, in partial remission: Secondary | ICD-10-CM | POA: Diagnosis not present

## 2018-11-05 DIAGNOSIS — F408 Other phobic anxiety disorders: Secondary | ICD-10-CM | POA: Diagnosis not present

## 2018-11-19 DIAGNOSIS — F408 Other phobic anxiety disorders: Secondary | ICD-10-CM | POA: Diagnosis not present

## 2018-12-08 ENCOUNTER — Ambulatory Visit (INDEPENDENT_AMBULATORY_CARE_PROVIDER_SITE_OTHER): Payer: BLUE CROSS/BLUE SHIELD | Admitting: Licensed Clinical Social Worker

## 2018-12-08 DIAGNOSIS — F3341 Major depressive disorder, recurrent, in partial remission: Secondary | ICD-10-CM

## 2018-12-23 DIAGNOSIS — I10 Essential (primary) hypertension: Secondary | ICD-10-CM | POA: Diagnosis not present

## 2018-12-23 DIAGNOSIS — J3089 Other allergic rhinitis: Secondary | ICD-10-CM | POA: Diagnosis not present

## 2018-12-23 DIAGNOSIS — F330 Major depressive disorder, recurrent, mild: Secondary | ICD-10-CM | POA: Diagnosis not present

## 2019-01-06 ENCOUNTER — Ambulatory Visit (INDEPENDENT_AMBULATORY_CARE_PROVIDER_SITE_OTHER): Payer: BC Managed Care – PPO | Admitting: Licensed Clinical Social Worker

## 2019-01-06 DIAGNOSIS — F3341 Major depressive disorder, recurrent, in partial remission: Secondary | ICD-10-CM | POA: Diagnosis not present

## 2019-02-03 ENCOUNTER — Ambulatory Visit (INDEPENDENT_AMBULATORY_CARE_PROVIDER_SITE_OTHER): Payer: BC Managed Care – PPO | Admitting: Licensed Clinical Social Worker

## 2019-02-03 DIAGNOSIS — F3341 Major depressive disorder, recurrent, in partial remission: Secondary | ICD-10-CM | POA: Diagnosis not present

## 2019-03-03 ENCOUNTER — Ambulatory Visit: Payer: BC Managed Care – PPO | Admitting: Licensed Clinical Social Worker

## 2019-04-13 DIAGNOSIS — F330 Major depressive disorder, recurrent, mild: Secondary | ICD-10-CM | POA: Diagnosis not present

## 2019-04-13 DIAGNOSIS — F429 Obsessive-compulsive disorder, unspecified: Secondary | ICD-10-CM | POA: Diagnosis not present

## 2019-06-22 ENCOUNTER — Ambulatory Visit: Payer: BC Managed Care – PPO | Admitting: Licensed Clinical Social Worker

## 2019-07-02 DIAGNOSIS — Z1231 Encounter for screening mammogram for malignant neoplasm of breast: Secondary | ICD-10-CM | POA: Diagnosis not present

## 2019-07-04 DIAGNOSIS — Z79899 Other long term (current) drug therapy: Secondary | ICD-10-CM | POA: Diagnosis not present

## 2019-07-06 DIAGNOSIS — F419 Anxiety disorder, unspecified: Secondary | ICD-10-CM | POA: Diagnosis not present

## 2019-07-06 DIAGNOSIS — F429 Obsessive-compulsive disorder, unspecified: Secondary | ICD-10-CM | POA: Diagnosis not present

## 2019-07-06 DIAGNOSIS — I10 Essential (primary) hypertension: Secondary | ICD-10-CM | POA: Diagnosis not present

## 2019-07-06 DIAGNOSIS — F330 Major depressive disorder, recurrent, mild: Secondary | ICD-10-CM | POA: Diagnosis not present

## 2019-08-18 ENCOUNTER — Ambulatory Visit (INDEPENDENT_AMBULATORY_CARE_PROVIDER_SITE_OTHER): Payer: BC Managed Care – PPO | Admitting: Licensed Clinical Social Worker

## 2019-08-18 DIAGNOSIS — F3341 Major depressive disorder, recurrent, in partial remission: Secondary | ICD-10-CM | POA: Diagnosis not present

## 2020-02-21 DIAGNOSIS — F331 Major depressive disorder, recurrent, moderate: Secondary | ICD-10-CM | POA: Diagnosis not present

## 2020-02-21 DIAGNOSIS — G4700 Insomnia, unspecified: Secondary | ICD-10-CM | POA: Diagnosis not present

## 2020-02-21 DIAGNOSIS — F411 Generalized anxiety disorder: Secondary | ICD-10-CM | POA: Diagnosis not present

## 2020-02-21 DIAGNOSIS — I10 Essential (primary) hypertension: Secondary | ICD-10-CM | POA: Diagnosis not present

## 2020-03-19 DIAGNOSIS — Z1159 Encounter for screening for other viral diseases: Secondary | ICD-10-CM | POA: Diagnosis not present

## 2020-03-19 DIAGNOSIS — Z0000 Encounter for general adult medical examination without abnormal findings: Secondary | ICD-10-CM | POA: Diagnosis not present

## 2020-03-22 DIAGNOSIS — Z23 Encounter for immunization: Secondary | ICD-10-CM | POA: Diagnosis not present

## 2020-03-22 DIAGNOSIS — Z0000 Encounter for general adult medical examination without abnormal findings: Secondary | ICD-10-CM | POA: Diagnosis not present

## 2020-03-23 DIAGNOSIS — E782 Mixed hyperlipidemia: Secondary | ICD-10-CM | POA: Diagnosis not present

## 2020-03-23 DIAGNOSIS — I10 Essential (primary) hypertension: Secondary | ICD-10-CM | POA: Diagnosis not present

## 2020-03-23 DIAGNOSIS — Z6841 Body Mass Index (BMI) 40.0 and over, adult: Secondary | ICD-10-CM | POA: Diagnosis not present

## 2020-04-20 DIAGNOSIS — E782 Mixed hyperlipidemia: Secondary | ICD-10-CM | POA: Diagnosis not present

## 2020-04-20 DIAGNOSIS — Z6841 Body Mass Index (BMI) 40.0 and over, adult: Secondary | ICD-10-CM | POA: Diagnosis not present

## 2020-04-20 DIAGNOSIS — I10 Essential (primary) hypertension: Secondary | ICD-10-CM | POA: Diagnosis not present

## 2020-05-16 DIAGNOSIS — R509 Fever, unspecified: Secondary | ICD-10-CM | POA: Diagnosis not present

## 2020-05-16 DIAGNOSIS — Z03818 Encounter for observation for suspected exposure to other biological agents ruled out: Secondary | ICD-10-CM | POA: Diagnosis not present

## 2020-05-16 DIAGNOSIS — Z1152 Encounter for screening for COVID-19: Secondary | ICD-10-CM | POA: Diagnosis not present

## 2020-06-01 DIAGNOSIS — E782 Mixed hyperlipidemia: Secondary | ICD-10-CM | POA: Diagnosis not present

## 2020-06-01 DIAGNOSIS — J069 Acute upper respiratory infection, unspecified: Secondary | ICD-10-CM | POA: Diagnosis not present

## 2020-06-01 DIAGNOSIS — Z6841 Body Mass Index (BMI) 40.0 and over, adult: Secondary | ICD-10-CM | POA: Diagnosis not present

## 2020-06-01 DIAGNOSIS — I10 Essential (primary) hypertension: Secondary | ICD-10-CM | POA: Diagnosis not present

## 2020-07-06 DIAGNOSIS — Z1231 Encounter for screening mammogram for malignant neoplasm of breast: Secondary | ICD-10-CM | POA: Diagnosis not present

## 2020-12-04 DIAGNOSIS — R197 Diarrhea, unspecified: Secondary | ICD-10-CM | POA: Diagnosis not present

## 2021-02-14 DIAGNOSIS — Z79899 Other long term (current) drug therapy: Secondary | ICD-10-CM | POA: Diagnosis not present

## 2021-03-22 DIAGNOSIS — F3342 Major depressive disorder, recurrent, in full remission: Secondary | ICD-10-CM | POA: Diagnosis not present

## 2021-03-22 DIAGNOSIS — F411 Generalized anxiety disorder: Secondary | ICD-10-CM | POA: Diagnosis not present

## 2021-04-25 DIAGNOSIS — F3342 Major depressive disorder, recurrent, in full remission: Secondary | ICD-10-CM | POA: Diagnosis not present

## 2021-04-25 DIAGNOSIS — F411 Generalized anxiety disorder: Secondary | ICD-10-CM | POA: Diagnosis not present

## 2021-07-04 DIAGNOSIS — F3342 Major depressive disorder, recurrent, in full remission: Secondary | ICD-10-CM | POA: Diagnosis not present

## 2021-07-04 DIAGNOSIS — F411 Generalized anxiety disorder: Secondary | ICD-10-CM | POA: Diagnosis not present

## 2021-07-09 DIAGNOSIS — Z1231 Encounter for screening mammogram for malignant neoplasm of breast: Secondary | ICD-10-CM | POA: Diagnosis not present

## 2021-09-03 DIAGNOSIS — R109 Unspecified abdominal pain: Secondary | ICD-10-CM | POA: Diagnosis not present

## 2021-09-03 DIAGNOSIS — H6502 Acute serous otitis media, left ear: Secondary | ICD-10-CM | POA: Diagnosis not present

## 2021-09-03 DIAGNOSIS — D509 Iron deficiency anemia, unspecified: Secondary | ICD-10-CM | POA: Diagnosis not present

## 2021-09-03 DIAGNOSIS — R197 Diarrhea, unspecified: Secondary | ICD-10-CM | POA: Diagnosis not present

## 2021-09-26 DIAGNOSIS — F411 Generalized anxiety disorder: Secondary | ICD-10-CM | POA: Diagnosis not present

## 2021-09-26 DIAGNOSIS — F3342 Major depressive disorder, recurrent, in full remission: Secondary | ICD-10-CM | POA: Diagnosis not present

## 2021-09-26 DIAGNOSIS — G479 Sleep disorder, unspecified: Secondary | ICD-10-CM | POA: Diagnosis not present

## 2021-09-26 DIAGNOSIS — Z79899 Other long term (current) drug therapy: Secondary | ICD-10-CM | POA: Diagnosis not present

## 2021-10-14 DIAGNOSIS — G4733 Obstructive sleep apnea (adult) (pediatric): Secondary | ICD-10-CM | POA: Diagnosis not present

## 2021-11-14 DIAGNOSIS — G4733 Obstructive sleep apnea (adult) (pediatric): Secondary | ICD-10-CM | POA: Diagnosis not present

## 2021-11-15 DIAGNOSIS — D509 Iron deficiency anemia, unspecified: Secondary | ICD-10-CM | POA: Diagnosis not present

## 2021-11-15 DIAGNOSIS — Z131 Encounter for screening for diabetes mellitus: Secondary | ICD-10-CM | POA: Diagnosis not present

## 2021-11-15 DIAGNOSIS — Z0000 Encounter for general adult medical examination without abnormal findings: Secondary | ICD-10-CM | POA: Diagnosis not present

## 2021-11-15 DIAGNOSIS — Z13220 Encounter for screening for lipoid disorders: Secondary | ICD-10-CM | POA: Diagnosis not present

## 2021-11-15 DIAGNOSIS — Z124 Encounter for screening for malignant neoplasm of cervix: Secondary | ICD-10-CM | POA: Diagnosis not present

## 2021-11-19 DIAGNOSIS — D509 Iron deficiency anemia, unspecified: Secondary | ICD-10-CM | POA: Diagnosis not present

## 2021-11-19 DIAGNOSIS — R109 Unspecified abdominal pain: Secondary | ICD-10-CM | POA: Diagnosis not present

## 2021-11-19 DIAGNOSIS — K2980 Duodenitis without bleeding: Secondary | ICD-10-CM | POA: Diagnosis not present

## 2021-11-19 DIAGNOSIS — K2930 Chronic superficial gastritis without bleeding: Secondary | ICD-10-CM | POA: Diagnosis not present

## 2021-11-19 DIAGNOSIS — K648 Other hemorrhoids: Secondary | ICD-10-CM | POA: Diagnosis not present

## 2021-11-19 DIAGNOSIS — K2970 Gastritis, unspecified, without bleeding: Secondary | ICD-10-CM | POA: Diagnosis not present

## 2021-11-19 DIAGNOSIS — K2100 Gastro-esophageal reflux disease with esophagitis, without bleeding: Secondary | ICD-10-CM | POA: Diagnosis not present

## 2021-11-19 DIAGNOSIS — K259 Gastric ulcer, unspecified as acute or chronic, without hemorrhage or perforation: Secondary | ICD-10-CM | POA: Diagnosis not present

## 2021-11-27 DIAGNOSIS — Z124 Encounter for screening for malignant neoplasm of cervix: Secondary | ICD-10-CM | POA: Diagnosis not present

## 2021-12-14 DIAGNOSIS — G4733 Obstructive sleep apnea (adult) (pediatric): Secondary | ICD-10-CM | POA: Diagnosis not present

## 2021-12-19 DIAGNOSIS — F3342 Major depressive disorder, recurrent, in full remission: Secondary | ICD-10-CM | POA: Diagnosis not present

## 2021-12-19 DIAGNOSIS — Z79899 Other long term (current) drug therapy: Secondary | ICD-10-CM | POA: Diagnosis not present

## 2021-12-19 DIAGNOSIS — F411 Generalized anxiety disorder: Secondary | ICD-10-CM | POA: Diagnosis not present

## 2022-02-25 DIAGNOSIS — K269 Duodenal ulcer, unspecified as acute or chronic, without hemorrhage or perforation: Secondary | ICD-10-CM | POA: Diagnosis not present

## 2022-02-25 DIAGNOSIS — K2950 Unspecified chronic gastritis without bleeding: Secondary | ICD-10-CM | POA: Diagnosis not present

## 2022-02-25 DIAGNOSIS — K319 Disease of stomach and duodenum, unspecified: Secondary | ICD-10-CM | POA: Diagnosis not present

## 2022-02-25 DIAGNOSIS — K2980 Duodenitis without bleeding: Secondary | ICD-10-CM | POA: Diagnosis not present

## 2022-02-25 DIAGNOSIS — K259 Gastric ulcer, unspecified as acute or chronic, without hemorrhage or perforation: Secondary | ICD-10-CM | POA: Diagnosis not present

## 2022-02-25 DIAGNOSIS — K209 Esophagitis, unspecified without bleeding: Secondary | ICD-10-CM | POA: Diagnosis not present

## 2022-03-26 DIAGNOSIS — K257 Chronic gastric ulcer without hemorrhage or perforation: Secondary | ICD-10-CM | POA: Diagnosis not present

## 2022-03-26 DIAGNOSIS — K589 Irritable bowel syndrome without diarrhea: Secondary | ICD-10-CM | POA: Diagnosis not present

## 2022-03-26 DIAGNOSIS — K269 Duodenal ulcer, unspecified as acute or chronic, without hemorrhage or perforation: Secondary | ICD-10-CM | POA: Diagnosis not present

## 2022-03-26 DIAGNOSIS — D509 Iron deficiency anemia, unspecified: Secondary | ICD-10-CM | POA: Diagnosis not present

## 2022-03-28 DIAGNOSIS — F605 Obsessive-compulsive personality disorder: Secondary | ICD-10-CM | POA: Diagnosis not present

## 2022-03-28 DIAGNOSIS — F418 Other specified anxiety disorders: Secondary | ICD-10-CM | POA: Diagnosis not present

## 2022-07-16 DIAGNOSIS — Z1231 Encounter for screening mammogram for malignant neoplasm of breast: Secondary | ICD-10-CM | POA: Diagnosis not present

## 2022-07-23 DIAGNOSIS — R300 Dysuria: Secondary | ICD-10-CM | POA: Diagnosis not present

## 2022-09-04 DIAGNOSIS — D509 Iron deficiency anemia, unspecified: Secondary | ICD-10-CM | POA: Diagnosis not present

## 2022-09-23 DIAGNOSIS — L680 Hirsutism: Secondary | ICD-10-CM | POA: Diagnosis not present

## 2022-09-23 DIAGNOSIS — L739 Follicular disorder, unspecified: Secondary | ICD-10-CM | POA: Diagnosis not present

## 2022-09-23 DIAGNOSIS — Z5181 Encounter for therapeutic drug level monitoring: Secondary | ICD-10-CM | POA: Diagnosis not present

## 2023-02-06 DIAGNOSIS — I10 Essential (primary) hypertension: Secondary | ICD-10-CM | POA: Diagnosis not present

## 2023-02-06 DIAGNOSIS — Z6841 Body Mass Index (BMI) 40.0 and over, adult: Secondary | ICD-10-CM | POA: Diagnosis not present

## 2023-06-05 DIAGNOSIS — J200 Acute bronchitis due to Mycoplasma pneumoniae: Secondary | ICD-10-CM | POA: Diagnosis not present

## 2023-06-19 DIAGNOSIS — J209 Acute bronchitis, unspecified: Secondary | ICD-10-CM | POA: Diagnosis not present

## 2023-06-19 DIAGNOSIS — R0981 Nasal congestion: Secondary | ICD-10-CM | POA: Diagnosis not present

## 2023-07-20 DIAGNOSIS — Z1231 Encounter for screening mammogram for malignant neoplasm of breast: Secondary | ICD-10-CM | POA: Diagnosis not present

## 2023-07-30 DIAGNOSIS — I10 Essential (primary) hypertension: Secondary | ICD-10-CM | POA: Diagnosis not present

## 2023-09-30 DIAGNOSIS — G4733 Obstructive sleep apnea (adult) (pediatric): Secondary | ICD-10-CM | POA: Diagnosis not present

## 2024-01-04 DIAGNOSIS — Z0000 Encounter for general adult medical examination without abnormal findings: Secondary | ICD-10-CM | POA: Diagnosis not present

## 2024-01-04 DIAGNOSIS — Z13220 Encounter for screening for lipoid disorders: Secondary | ICD-10-CM | POA: Diagnosis not present

## 2024-01-04 DIAGNOSIS — I10 Essential (primary) hypertension: Secondary | ICD-10-CM | POA: Diagnosis not present

## 2024-01-04 DIAGNOSIS — F418 Other specified anxiety disorders: Secondary | ICD-10-CM | POA: Diagnosis not present

## 2024-01-06 DIAGNOSIS — G4733 Obstructive sleep apnea (adult) (pediatric): Secondary | ICD-10-CM | POA: Diagnosis not present

## 2024-07-20 DIAGNOSIS — Z1231 Encounter for screening mammogram for malignant neoplasm of breast: Secondary | ICD-10-CM | POA: Diagnosis not present
